# Patient Record
Sex: Male | Born: 1985 | Race: Black or African American | Hispanic: No | Marital: Single | Smoking: Never smoker
Health system: Southern US, Community
[De-identification: ages and names within clinical notes are randomized; demographics above are authoritative.]

---
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
## 2006-08-16 ENCOUNTER — Emergency Department (HOSPITAL_COMMUNITY): Admission: EM | Admit: 2006-08-16 | Discharge: 2006-08-16 | Payer: Self-pay | Admitting: Emergency Medicine

## 2009-09-21 ENCOUNTER — Emergency Department (HOSPITAL_COMMUNITY): Admission: EM | Admit: 2009-09-21 | Discharge: 2009-09-22 | Payer: Self-pay | Admitting: Emergency Medicine

## 2011-04-09 ENCOUNTER — Emergency Department (HOSPITAL_COMMUNITY)
Admission: EM | Admit: 2011-04-09 | Discharge: 2011-04-09 | Disposition: A | Discharge status: Home / Self Care | Payer: No Typology Code available for payment source | Attending: Emergency Medicine | Admitting: Emergency Medicine

## 2011-04-09 ENCOUNTER — Encounter: Payer: Self-pay | Admitting: *Deleted

## 2011-04-09 ENCOUNTER — Emergency Department (HOSPITAL_COMMUNITY): Payer: No Typology Code available for payment source

## 2011-04-09 DIAGNOSIS — S39012A Strain of muscle, fascia and tendon of lower back, initial encounter: Secondary | ICD-10-CM

## 2011-04-09 DIAGNOSIS — IMO0002 Reserved for concepts with insufficient information to code with codable children: Secondary | ICD-10-CM | POA: Insufficient documentation

## 2011-04-09 DIAGNOSIS — S161XXA Strain of muscle, fascia and tendon at neck level, initial encounter: Secondary | ICD-10-CM

## 2011-04-09 DIAGNOSIS — M542 Cervicalgia: Secondary | ICD-10-CM | POA: Insufficient documentation

## 2011-04-09 DIAGNOSIS — S139XXA Sprain of joints and ligaments of unspecified parts of neck, initial encounter: Secondary | ICD-10-CM | POA: Insufficient documentation

## 2011-04-09 DIAGNOSIS — M549 Dorsalgia, unspecified: Secondary | ICD-10-CM | POA: Insufficient documentation

## 2011-04-09 DIAGNOSIS — T148XXA Other injury of unspecified body region, initial encounter: Secondary | ICD-10-CM | POA: Insufficient documentation

## 2011-04-09 DIAGNOSIS — Y92410 Unspecified street and highway as the place of occurrence of the external cause: Secondary | ICD-10-CM | POA: Insufficient documentation

## 2011-04-09 DIAGNOSIS — R51 Headache: Secondary | ICD-10-CM | POA: Insufficient documentation

## 2011-04-09 DIAGNOSIS — T07XXXA Unspecified multiple injuries, initial encounter: Secondary | ICD-10-CM

## 2011-04-09 DIAGNOSIS — M25529 Pain in unspecified elbow: Secondary | ICD-10-CM | POA: Insufficient documentation

## 2011-04-09 MED ORDER — HYDROCODONE-ACETAMINOPHEN 5-500 MG PO TABS
1.0000 | ORAL_TABLET | Freq: Four times a day (QID) | ORAL | Status: AC | PRN
Start: 2011-04-09 — End: 2011-04-19

## 2011-04-09 MED ORDER — CYCLOBENZAPRINE HCL 10 MG PO TABS: 10.0000 mg | ORAL_TABLET | Freq: Three times a day (TID) | ORAL | Status: AC | PRN

## 2011-04-09 MED ORDER — MORPHINE SULFATE 4 MG/ML IJ SOLN
4.0000 mg | Freq: Once | INTRAMUSCULAR | Status: AC
  Administered 2011-04-09: 4 mg via INTRAVENOUS
  Filled 2011-04-09: qty 1

## 2011-04-09 MED ORDER — NAPROXEN 500 MG PO TABS: 500.0000 mg | ORAL_TABLET | Freq: Two times a day (BID) | ORAL | Status: AC
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
## 2011-04-09 NOTE — ED Notes (Signed)
Patient transported to X-ray 
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
## 2013-01-10 ENCOUNTER — Emergency Department (HOSPITAL_COMMUNITY): Payer: Self-pay

## 2013-01-10 ENCOUNTER — Emergency Department (HOSPITAL_COMMUNITY)
Admission: EM | Admit: 2013-01-10 | Discharge: 2013-01-10 | Disposition: A | Discharge status: Home / Self Care | Payer: Self-pay | Attending: Emergency Medicine | Admitting: Emergency Medicine

## 2013-01-10 ENCOUNTER — Emergency Department (HOSPITAL_COMMUNITY): Payer: No Typology Code available for payment source

## 2013-01-10 ENCOUNTER — Encounter (HOSPITAL_COMMUNITY): Payer: Self-pay | Admitting: *Deleted

## 2013-01-10 DIAGNOSIS — Y9389 Activity, other specified: Secondary | ICD-10-CM | POA: Insufficient documentation

## 2013-01-10 DIAGNOSIS — S134XXA Sprain of ligaments of cervical spine, initial encounter: Secondary | ICD-10-CM

## 2013-01-10 DIAGNOSIS — H53149 Visual discomfort, unspecified: Secondary | ICD-10-CM | POA: Insufficient documentation

## 2013-01-10 DIAGNOSIS — S0990XA Unspecified injury of head, initial encounter: Secondary | ICD-10-CM | POA: Insufficient documentation

## 2013-01-10 DIAGNOSIS — Y92410 Unspecified street and highway as the place of occurrence of the external cause: Secondary | ICD-10-CM | POA: Insufficient documentation

## 2013-01-10 MED ORDER — CYCLOBENZAPRINE HCL 10 MG PO TABS: 10.0000 mg | ORAL_TABLET | Freq: Two times a day (BID) | ORAL | Status: AC | PRN

## 2013-01-10 MED ORDER — HYDROCODONE-ACETAMINOPHEN 5-325 MG PO TABS: 1.0000 | ORAL_TABLET | ORAL | Status: AC | PRN

## 2013-01-10 MED ORDER — OXYCODONE-ACETAMINOPHEN 5-325 MG PO TABS
1.0000 | ORAL_TABLET | Freq: Once | ORAL | Status: AC
  Administered 2013-01-10: 1 via ORAL
  Filled 2013-01-10: qty 1

## 2013-01-10 MED ORDER — SILVER SULFADIAZINE 1 % EX CREA
TOPICAL_CREAM | Freq: Once | CUTANEOUS | Status: AC
  Administered 2013-01-10: 18:00:00 via TOPICAL
  Filled 2013-01-10: qty 85

## 2013-01-10 NOTE — ED Provider Notes (Signed)
Medical screening examination/treatment/procedure(s) were performed by non-physician practitioner and as supervising physician I was immediately available for consultation/collaboration.   Gwyneth Sprout, MD 01/10/13 [id number]
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
## 2013-01-10 NOTE — ED Notes (Signed)
Neck brace placed in triage.
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
## 2018-05-17 ENCOUNTER — Encounter (HOSPITAL_COMMUNITY): Payer: Self-pay

## 2018-05-17 ENCOUNTER — Other Ambulatory Visit: Payer: Self-pay

## 2018-05-17 ENCOUNTER — Emergency Department (HOSPITAL_COMMUNITY)
Admission: EM | Admit: 2018-05-17 | Discharge: 2018-05-18 | Payer: Self-pay | Attending: Emergency Medicine | Admitting: Emergency Medicine

## 2018-05-17 DIAGNOSIS — Y939 Activity, unspecified: Secondary | ICD-10-CM | POA: Insufficient documentation

## 2018-05-17 DIAGNOSIS — W540XXA Bitten by dog, initial encounter: Secondary | ICD-10-CM | POA: Insufficient documentation

## 2018-05-17 DIAGNOSIS — Y999 Unspecified external cause status: Secondary | ICD-10-CM | POA: Insufficient documentation

## 2018-05-17 DIAGNOSIS — L539 Erythematous condition, unspecified: Secondary | ICD-10-CM | POA: Insufficient documentation

## 2018-05-17 DIAGNOSIS — Z23 Encounter for immunization: Secondary | ICD-10-CM | POA: Insufficient documentation

## 2018-05-17 DIAGNOSIS — F10920 Alcohol use, unspecified with intoxication, uncomplicated: Secondary | ICD-10-CM | POA: Insufficient documentation

## 2018-05-17 DIAGNOSIS — W57XXXA Bitten or stung by nonvenomous insect and other nonvenomous arthropods, initial encounter: Secondary | ICD-10-CM | POA: Insufficient documentation

## 2018-05-17 DIAGNOSIS — W548XXA Other contact with dog, initial encounter: Secondary | ICD-10-CM

## 2018-05-17 DIAGNOSIS — T63301A Toxic effect of unspecified spider venom, accidental (unintentional), initial encounter: Secondary | ICD-10-CM

## 2018-05-17 DIAGNOSIS — S50862A Insect bite (nonvenomous) of left forearm, initial encounter: Secondary | ICD-10-CM | POA: Insufficient documentation

## 2018-05-17 DIAGNOSIS — S50812A Abrasion of left forearm, initial encounter: Secondary | ICD-10-CM | POA: Insufficient documentation

## 2018-05-17 DIAGNOSIS — Y929 Unspecified place or not applicable: Secondary | ICD-10-CM | POA: Insufficient documentation

## 2018-05-17 MED ORDER — BACITRACIN ZINC 500 UNIT/GM EX OINT
1.0000 "application " | TOPICAL_OINTMENT | Freq: Two times a day (BID) | CUTANEOUS | Status: DC
  Administered 2018-05-18: 1 via TOPICAL
  Filled 2018-05-17: qty 0.9

## 2018-05-17 MED ORDER — TETANUS-DIPHTH-ACELL PERTUSSIS 5-2.5-18.5 LF-MCG/0.5 IM SUSP
0.5000 mL | Freq: Once | INTRAMUSCULAR | Status: AC
  Administered 2018-05-18: 0.5 mL via INTRAMUSCULAR
  Filled 2018-05-17: qty 0.5

## 2018-05-17 MED ORDER — DOXYCYCLINE HYCLATE 100 MG PO TABS
100.0000 mg | ORAL_TABLET | Freq: Once | ORAL | Status: AC
  Administered 2018-05-18: 100 mg via ORAL
  Filled 2018-05-17: qty 1

## 2018-05-17 MED ORDER — DOXYCYCLINE HYCLATE 100 MG PO CAPS: 100.0000 mg | ORAL_CAPSULE | Freq: Two times a day (BID) | ORAL | 0 refills | Status: AC

## 2018-05-17 NOTE — ED Triage Notes (Signed)
Pt BIB [employer] for eval of dog bite from police canine to L wrist. Pt in police custody at this time

## 2018-05-17 NOTE — ED Provider Notes (Signed)
[facility] EMERGENCY DEPARTMENT Provider Note   CSN: [id number] Arrival date & time: 05/17/18  2324     History   Chief Complaint Chief Complaint  Patient presents with  . Animal Bite  . Alcohol Intoxication    HPI Nathaniel Melton is a 33 y.o. male.  Patient presents to the emergency department with a chief complaint of dog bite.  He has brought in in [employer] custody.  Reports being scratched or bitten by a police dog.  Last tetanus shot unknown.  Reports having had some alcohol tonight.  Also reports having a spider bite on his left forearm.  States the spider bite is draining some pus.  Denies any IV drug use.  The history is provided by the patient. No language interpreter was used.    History reviewed. No pertinent past medical history.  There are no active problems to display for this patient.   History reviewed. No pertinent surgical history.      Home Medications    Prior to Admission medications   Medication Sig Start Date End Date Taking? Authorizing Provider  cyclobenzaprine (FLEXERIL) 10 MG tablet Take 1 tablet (10 mg total) by mouth 2 (two) times daily as needed for muscle spasms. 01/10/13   Marlon Pel, PA-C  HYDROcodone-acetaminophen (NORCO/VICODIN) 5-325 MG per tablet Take 1 tablet by mouth every 4 (four) hours as needed for pain. 01/10/13   Marlon Pel, PA-C    Family History History reviewed. No pertinent family history.  Social History Social History   Tobacco Use  . Smoking status: Never Smoker  Substance Use Topics  . Alcohol use: Yes    Comment: occ  . Drug use: No     Allergies   Patient has no known allergies.   Review of Systems Review of Systems  All other systems reviewed and are negative.    Physical Exam Updated Vital Signs There were no vitals taken for this visit.  Physical Exam Vitals signs and nursing note reviewed.  Constitutional:      General: He is not in acute distress.    Appearance:  He is not diaphoretic.  HENT:     Head: Normocephalic and atraumatic.  Eyes:     Conjunctiva/sclera: Conjunctivae normal.     Pupils: Pupils are equal, round, and reactive to light.  Neck:     Trachea: No tracheal deviation.  Cardiovascular:     Rate and Rhythm: Normal rate.  Pulmonary:     Effort: Pulmonary effort is normal. No respiratory distress.  Abdominal:     Palpations: Abdomen is soft.  Musculoskeletal: Normal range of motion.  Skin:    General: Skin is warm and dry.     Comments: 2 x 2 centimeter area of indurated skin on the left distal posterior forearm  Abrasions to left upper forearm  No puncture wounds  Neurological:     Mental Status: He is alert and oriented to person, place, and time.  Psychiatric:        Judgment: Judgment normal.      ED Treatments / Results  Labs (all labs ordered are listed, but only abnormal results are displayed) Labs Reviewed - No data to display  EKG None  Radiology No results found.  Procedures Procedures (including critical care time)  Medications Ordered in ED Medications  Tdap (BOOSTRIX) injection 0.5 mL (has no administration in time range)  doxycycline (VIBRA-TABS) tablet 100 mg (has no administration in time range)     Initial Impression /  Assessment and Plan / ED Course  I have reviewed the triage vital signs and the nursing notes.  Pertinent labs & imaging results that were available during my care of the patient were reviewed by me and considered in my medical decision making (see chart for details).     Pt. With a small abscess, nothing is draining, I did unroofed the abscess, but nothing drained.  Will treat for cellulitis.  Regarding the dog bite, there is no puncture wound, it is superficial abrasions only, which look to be more from the canines claws rather than teeth.  Has there is no puncture wound, do not feel that anything other than topical antibiotics is indicated.  I will update his tetanus  shot.  He will be discharged with doxycycline for his spider bite/cellulitis.  Final Clinical Impressions(s) / ED Diagnoses   Final diagnoses:  Spider bite wound, accidental or unintentional, initial encounter  Dog scratch  Dog bite, initial encounter  Alcoholic intoxication without complication [facility])    ED Discharge Orders         Ordered    doxycycline (VIBRAMYCIN) 100 MG capsule  2 times daily     05/17/18 2351           Mad[place], Posa, PA-C 05/17/18 2352    Gilda Crease, MD 05/18/18 [phone]
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
## 2020-08-08 ENCOUNTER — Ambulatory Visit (HOSPITAL_COMMUNITY)
Admission: EM | Admit: 2020-08-08 | Discharge: 2020-08-08 | Disposition: A | Discharge status: Home / Self Care | Payer: Self-pay | Attending: Family Medicine | Admitting: Family Medicine

## 2020-08-08 ENCOUNTER — Encounter (HOSPITAL_COMMUNITY): Payer: Self-pay

## 2020-08-08 DIAGNOSIS — B029 Zoster without complications: Secondary | ICD-10-CM

## 2020-08-08 MED ORDER — VALACYCLOVIR HCL 1 G PO TABS: 1000.0000 mg | ORAL_TABLET | Freq: Three times a day (TID) | ORAL | 0 refills | Status: AC

## 2020-08-08 MED ORDER — IBUPROFEN 800 MG PO TABS: 800.0000 mg | ORAL_TABLET | Freq: Three times a day (TID) | ORAL | 0 refills | Status: AC

## 2020-08-08 MED ORDER — CAPSAICIN 0.075 % EX CREA: 1.0000 "application " | TOPICAL_CREAM | Freq: Two times a day (BID) | CUTANEOUS | 0 refills | Status: AC | PRN

## 2020-08-08 NOTE — ED Triage Notes (Addendum)
Pt in with c/o sharp pain that radiates from his left groin to upper abdomen that he noticed 3 days ago  Pt also c/o rash that has blistered up that starts on his back and goes around to his belly button  Pt has been using calamine lotio for relief of itching

## 2020-08-08 NOTE — ED Provider Notes (Signed)
  [facility]   [id number] 08/08/20 Arrival Time: 0815  ASSESSMENT & PLAN:  1. Herpes zoster without complication    Begin: Meds ordered this encounter  Medications  . valACYclovir (VALTREX) 1000 MG tablet    Sig: Take 1 tablet (1,000 mg total) by mouth 3 (three) times daily.    Dispense:  21 tablet    Refill:  0  . capsicum (ZOSTRIX) 0.075 % topical cream    Sig: Apply 1 application topically 2 (two) times daily as needed.    Dispense:  28.3 g    Refill:  0  . ibuprofen (ADVIL) 800 MG tablet    Sig: Take 1 tablet (800 mg total) by mouth 3 (three) times daily with meals.    Dispense:  21 tablet    Refill:  0   No sing of bacterial skin infection.  Will follow up with PCP or here if worsening or failing to improve as anticipated. Reviewed expectations re: course of current medical issues. Questions answered. Outlined signs and symptoms indicating need for more acute intervention. Patient verbalized understanding. After Visit Summary given.   SUBJECTIVE:  Nathaniel Melton is a 35 y.o. male who presents with a skin complaint. Lower LEFT abdomen; 2-3 days ago with pain to area; rash for 2 days; tingling/burning. Afebrile. OTC cream without relief. Itches also. Normal PO intake without n/v/d.   OBJECTIVE: Vitals:   08/08/20 0902 08/08/20 0903  BP:  134/87  Pulse: 81   Resp: 17   Temp: 98.3 F (36.8 C)   SpO2: 99%     General appearance: alert; no distress HEENT: [place]; AT Neck: supple with FROM Lungs: unlabored Abd: soft Extremities: no edema; moves all extremities normally Skin: warm and dry; crops of red/purplish papules/vesicles over lower left abdomen extending around side to back; TTP Psychological: alert and cooperative; normal mood and affect  No Known Allergies  History reviewed. No pertinent past medical history. Social History   Socioeconomic History  . Marital status: Single    Spouse name: Not on file  . Number of children: Not on file  .  Years of education: Not on file  . Highest education level: Not on file  Occupational History  . Not on file  Tobacco Use  . Smoking status: Never Smoker  . Smokeless tobacco: Never Used  Substance and Sexual Activity  . Alcohol use: Yes    Comment: occ  . Drug use: No  . Sexual activity: Not on file  Other Topics Concern  . Not on file  Social History Narrative  . Not on file   Social Determinants of Health   Financial Resource Strain: Not on file  Food Insecurity: Not on file  Transportation Needs: Not on file  Physical Activity: Not on file  Stress: Not on file  Social Connections: Not on file  Intimate Partner Violence: Not on file   History reviewed. No pertinent family history. History reviewed. No pertinent surgical history.   Mardella Layman, MD 08/08/20 1002
